# Patient Record
Sex: Female | Born: 1955 | Race: White | Hispanic: No | Marital: Married | Smoking: Former smoker
Health system: Southern US, Community
[De-identification: ages and names within clinical notes are randomized; demographics above are authoritative.]

## PROBLEM LIST (undated history)

## (undated) DIAGNOSIS — N200 Calculus of kidney: Secondary | ICD-10-CM

## (undated) DIAGNOSIS — R200 Anesthesia of skin: Secondary | ICD-10-CM

## (undated) DIAGNOSIS — R739 Hyperglycemia, unspecified: Secondary | ICD-10-CM

## (undated) DIAGNOSIS — H43819 Vitreous degeneration, unspecified eye: Secondary | ICD-10-CM

## (undated) DIAGNOSIS — M542 Cervicalgia: Secondary | ICD-10-CM

## (undated) DIAGNOSIS — H538 Other visual disturbances: Secondary | ICD-10-CM

## (undated) DIAGNOSIS — R0789 Other chest pain: Secondary | ICD-10-CM

## (undated) DIAGNOSIS — R0602 Shortness of breath: Secondary | ICD-10-CM

## (undated) DIAGNOSIS — H40059 Ocular hypertension, unspecified eye: Secondary | ICD-10-CM

## (undated) DIAGNOSIS — M5030 Other cervical disc degeneration, unspecified cervical region: Secondary | ICD-10-CM

## (undated) DIAGNOSIS — M5412 Radiculopathy, cervical region: Secondary | ICD-10-CM

## (undated) DIAGNOSIS — R202 Paresthesia of skin: Secondary | ICD-10-CM

## (undated) HISTORY — PX: COLONOSCOPY: SHX174

## (undated) HISTORY — PX: PHOTOREFRACTIVE KERATOTOMY: SHX216

## (undated) HISTORY — PX: ABDOMINAL HYSTERECTOMY: SHX81

## (undated) HISTORY — PX: CHOLECYSTECTOMY: SHX55

## (undated) HISTORY — PX: ESOPHAGOGASTRODUODENOSCOPY: SHX1529

## (undated) HISTORY — PX: TONSILLECTOMY: SUR1361

## (undated) HISTORY — PX: APPENDECTOMY: SHX54

---

## 2010-10-15 ENCOUNTER — Ambulatory Visit: Payer: Self-pay | Admitting: Nurse Practitioner

## 2010-10-21 ENCOUNTER — Ambulatory Visit: Payer: Self-pay | Admitting: Nurse Practitioner

## 2011-05-07 ENCOUNTER — Ambulatory Visit: Payer: Self-pay | Admitting: Nurse Practitioner

## 2012-09-21 ENCOUNTER — Ambulatory Visit: Payer: Self-pay | Admitting: Nurse Practitioner

## 2013-06-15 DIAGNOSIS — Z87442 Personal history of urinary calculi: Secondary | ICD-10-CM | POA: Insufficient documentation

## 2013-06-15 DIAGNOSIS — R739 Hyperglycemia, unspecified: Secondary | ICD-10-CM | POA: Insufficient documentation

## 2013-12-02 ENCOUNTER — Ambulatory Visit: Payer: Self-pay | Admitting: Gastroenterology

## 2013-12-06 ENCOUNTER — Ambulatory Visit: Payer: Self-pay | Admitting: Family Medicine

## 2013-12-07 LAB — PATHOLOGY REPORT

## 2013-12-09 ENCOUNTER — Ambulatory Visit: Payer: Self-pay | Admitting: Family Medicine

## 2014-09-06 DIAGNOSIS — M5412 Radiculopathy, cervical region: Secondary | ICD-10-CM | POA: Insufficient documentation

## 2014-09-06 DIAGNOSIS — M5030 Other cervical disc degeneration, unspecified cervical region: Secondary | ICD-10-CM | POA: Insufficient documentation

## 2014-12-19 DIAGNOSIS — M542 Cervicalgia: Secondary | ICD-10-CM | POA: Insufficient documentation

## 2014-12-19 DIAGNOSIS — G8929 Other chronic pain: Secondary | ICD-10-CM | POA: Insufficient documentation

## 2014-12-19 DIAGNOSIS — R200 Anesthesia of skin: Secondary | ICD-10-CM | POA: Insufficient documentation

## 2014-12-19 DIAGNOSIS — R202 Paresthesia of skin: Secondary | ICD-10-CM | POA: Insufficient documentation

## 2015-02-21 DIAGNOSIS — H538 Other visual disturbances: Secondary | ICD-10-CM | POA: Insufficient documentation

## 2015-02-26 ENCOUNTER — Other Ambulatory Visit: Payer: Self-pay | Admitting: Neurology

## 2015-02-26 DIAGNOSIS — G8929 Other chronic pain: Secondary | ICD-10-CM

## 2015-02-26 DIAGNOSIS — M542 Cervicalgia: Principal | ICD-10-CM

## 2015-03-02 ENCOUNTER — Ambulatory Visit: Payer: Self-pay

## 2015-03-06 ENCOUNTER — Ambulatory Visit
Admission: RE | Admit: 2015-03-06 | Discharge: 2015-03-06 | Disposition: A | Discharge status: Home / Self Care | Payer: BC Managed Care – PPO | Source: Ambulatory Visit | Attending: Neurology | Admitting: Neurology

## 2015-03-06 DIAGNOSIS — M47892 Other spondylosis, cervical region: Secondary | ICD-10-CM | POA: Diagnosis not present

## 2015-03-06 DIAGNOSIS — M5412 Radiculopathy, cervical region: Secondary | ICD-10-CM | POA: Diagnosis not present

## 2015-03-06 DIAGNOSIS — M542 Cervicalgia: Secondary | ICD-10-CM | POA: Diagnosis present

## 2015-03-06 DIAGNOSIS — G8929 Other chronic pain: Secondary | ICD-10-CM

## 2015-03-06 DIAGNOSIS — R200 Anesthesia of skin: Secondary | ICD-10-CM | POA: Diagnosis present

## 2017-10-20 ENCOUNTER — Other Ambulatory Visit: Payer: Self-pay | Admitting: Orthopedic Surgery

## 2017-10-20 DIAGNOSIS — M5416 Radiculopathy, lumbar region: Secondary | ICD-10-CM

## 2017-10-26 ENCOUNTER — Ambulatory Visit
Admission: RE | Admit: 2017-10-26 | Discharge: 2017-10-26 | Disposition: A | Discharge status: Home / Self Care | Payer: BC Managed Care – PPO | Source: Ambulatory Visit | Attending: Orthopedic Surgery | Admitting: Orthopedic Surgery

## 2017-10-26 DIAGNOSIS — M5416 Radiculopathy, lumbar region: Secondary | ICD-10-CM

## 2018-01-27 DIAGNOSIS — R0602 Shortness of breath: Secondary | ICD-10-CM | POA: Insufficient documentation

## 2018-02-12 DIAGNOSIS — H43811 Vitreous degeneration, right eye: Secondary | ICD-10-CM | POA: Insufficient documentation

## 2018-02-12 DIAGNOSIS — H179 Unspecified corneal scar and opacity: Secondary | ICD-10-CM | POA: Insufficient documentation

## 2018-02-12 DIAGNOSIS — H40053 Ocular hypertension, bilateral: Secondary | ICD-10-CM | POA: Insufficient documentation

## 2018-02-12 DIAGNOSIS — Z9889 Other specified postprocedural states: Secondary | ICD-10-CM | POA: Insufficient documentation

## 2018-04-20 ENCOUNTER — Encounter: Payer: Self-pay | Admitting: *Deleted

## 2018-04-21 ENCOUNTER — Ambulatory Visit: Payer: BC Managed Care – PPO | Admitting: Certified Registered"

## 2018-04-21 ENCOUNTER — Encounter
Admission: RE | Disposition: A | Discharge status: Home / Self Care | Payer: Self-pay | Source: Ambulatory Visit | Attending: Internal Medicine

## 2018-04-21 ENCOUNTER — Encounter: Payer: Self-pay | Admitting: Anesthesiology

## 2018-04-21 ENCOUNTER — Ambulatory Visit
Admission: RE | Admit: 2018-04-21 | Discharge: 2018-04-21 | Disposition: A | Discharge status: Home / Self Care | Payer: BC Managed Care – PPO | Source: Ambulatory Visit | Attending: Internal Medicine | Admitting: Internal Medicine

## 2018-04-21 DIAGNOSIS — R1013 Epigastric pain: Secondary | ICD-10-CM | POA: Diagnosis present

## 2018-04-21 DIAGNOSIS — R1310 Dysphagia, unspecified: Secondary | ICD-10-CM | POA: Insufficient documentation

## 2018-04-21 DIAGNOSIS — Z79899 Other long term (current) drug therapy: Secondary | ICD-10-CM | POA: Insufficient documentation

## 2018-04-21 DIAGNOSIS — K219 Gastro-esophageal reflux disease without esophagitis: Secondary | ICD-10-CM | POA: Insufficient documentation

## 2018-04-21 DIAGNOSIS — K319 Disease of stomach and duodenum, unspecified: Secondary | ICD-10-CM | POA: Diagnosis not present

## 2018-04-21 DIAGNOSIS — Z87891 Personal history of nicotine dependence: Secondary | ICD-10-CM | POA: Diagnosis not present

## 2018-04-21 HISTORY — DX: Cervicalgia: M54.2

## 2018-04-21 HISTORY — DX: Calculus of kidney: N20.0

## 2018-04-21 HISTORY — DX: Other chest pain: R07.89

## 2018-04-21 HISTORY — DX: Anesthesia of skin: R20.0

## 2018-04-21 HISTORY — DX: Radiculopathy, cervical region: M54.12

## 2018-04-21 HISTORY — DX: Vitreous degeneration, unspecified eye: H43.819

## 2018-04-21 HISTORY — DX: Other cervical disc degeneration, unspecified cervical region: M50.30

## 2018-04-21 HISTORY — DX: Hypercalcemia: E83.52

## 2018-04-21 HISTORY — PX: ESOPHAGOGASTRODUODENOSCOPY (EGD) WITH PROPOFOL: SHX5813

## 2018-04-21 HISTORY — DX: Shortness of breath: R06.02

## 2018-04-21 HISTORY — DX: Ocular hypertension, unspecified eye: H40.059

## 2018-04-21 HISTORY — DX: Paresthesia of skin: R20.2

## 2018-04-21 HISTORY — DX: Other visual disturbances: H53.8

## 2018-04-21 HISTORY — DX: Hyperglycemia, unspecified: R73.9

## 2018-04-21 SURGERY — ESOPHAGOGASTRODUODENOSCOPY (EGD) WITH PROPOFOL
Anesthesia: General

## 2018-04-21 MED ORDER — SODIUM CHLORIDE 0.9 % IV SOLN
INTRAVENOUS | Status: DC
  Administered 2018-04-21: 1000 mL via INTRAVENOUS

## 2018-04-21 MED ORDER — FENTANYL CITRATE (PF) 100 MCG/2ML IJ SOLN
INTRAMUSCULAR | Status: AC
  Filled 2018-04-21: qty 2

## 2018-04-21 MED ORDER — LIDOCAINE HCL (PF) 1 % IJ SOLN
INTRAMUSCULAR | Status: AC
  Administered 2018-04-21: 0.3 mL via INTRADERMAL
  Filled 2018-04-21: qty 2

## 2018-04-21 MED ORDER — LIDOCAINE HCL (CARDIAC) PF 100 MG/5ML IV SOSY
PREFILLED_SYRINGE | INTRAVENOUS | Status: DC | PRN
  Administered 2018-04-21: 80 mg via INTRAVENOUS

## 2018-04-21 MED ORDER — PROPOFOL 10 MG/ML IV BOLUS
INTRAVENOUS | Status: DC | PRN
  Administered 2018-04-21: 20 mg via INTRAVENOUS
  Administered 2018-04-21: 80 mg via INTRAVENOUS

## 2018-04-21 MED ORDER — FENTANYL CITRATE (PF) 100 MCG/2ML IJ SOLN
INTRAMUSCULAR | Status: DC | PRN
  Administered 2018-04-21: 50 ug via INTRAVENOUS

## 2018-04-21 MED ORDER — LIDOCAINE HCL (PF) 1 % IJ SOLN
2.0000 mL | Freq: Once | INTRAMUSCULAR | Status: AC
  Administered 2018-04-21: 0.3 mL via INTRADERMAL

## 2018-04-21 MED ORDER — PROPOFOL 10 MG/ML IV BOLUS
INTRAVENOUS | Status: AC
  Filled 2018-04-21: qty 20

## 2018-04-21 NOTE — Anesthesia Post-op Follow-up Note (Signed)
Anesthesia QCDR form completed.        

## 2018-04-21 NOTE — Interval H&P Note (Signed)
History and Physical Interval Note:  04/21/2018 11:37 AM  Tina Crawford  has presented today for surgery, with the diagnosis of DYSPHAGIA  The various methods of treatment have been discussed with the patient and family. After consideration of risks, benefits and other options for treatment, the patient has consented to  Procedure(s): ESOPHAGOGASTRODUODENOSCOPY (EGD) WITH PROPOFOL (N/A) as a surgical intervention .  The patient's history has been reviewed, patient examined, no change in status, stable for surgery.  I have reviewed the patient's chart and labs.  Questions were answered to the patient's satisfaction.     [place], [place]

## 2018-04-21 NOTE — Anesthesia Postprocedure Evaluation (Signed)
Anesthesia Post Note  Patient: Tina Crawford  Procedure(s) Performed: ESOPHAGOGASTRODUODENOSCOPY (EGD) WITH PROPOFOL (N/A )  Patient location during evaluation: Endoscopy Anesthesia Type: General Level of consciousness: awake and alert Pain management: pain level controlled Vital Signs Assessment: post-procedure vital signs reviewed and stable Respiratory status: spontaneous breathing, nonlabored ventilation, respiratory function stable and patient connected to nasal cannula oxygen Cardiovascular status: blood pressure returned to baseline and stable Postop Assessment: no apparent nausea or vomiting Anesthetic complications: no     Last Vitals:  Vitals:   04/21/18 1210 04/21/18 1220  BP: 122/72 130/76  Pulse: 64 67  Resp: 17 14  Temp:    SpO2: 96% 99%    Last Pain:  Vitals:   04/21/18 1220  TempSrc:   PainSc: 0-No pain                 Lenard Simmer

## 2018-04-21 NOTE — Op Note (Signed)
[facility] Gastroenterology Patient Name: Tina Crawford Procedure Date: 04/21/2018 11:05 AM MRN: [id number] Account #: [id number] Date of Birth: [birth year] Admit Type: Outpatient Age: 62 Room: [facility] ENDO ROOM 3 Gender: Female Note Status: Finalized Procedure:            Upper GI endoscopy Indications:          Epigastric abdominal pain, Dysphagia, Follow-up of                        esophageal reflux Providers:            Boykin Nearing. [name] MD, MD Referring MD:         Letitia Caul MD, MD (Referring MD) Medicines:            Propofol per Anesthesia Complications:        No immediate complications. Procedure:            Pre-Anesthesia Assessment:                       - The risks and benefits of the procedure and the                        sedation options and risks were discussed with the                        patient. All questions were answered and informed                        consent was obtained.                       - Patient identification and proposed procedure were                        verified prior to the procedure by the nurse. The                        procedure was verified in the procedure room.                       - ASA Grade Assessment: III - A patient with severe                        systemic disease.                       - After reviewing the risks and benefits, the patient                        was deemed in satisfactory condition to undergo the                        procedure.                       After obtaining informed consent, the endoscope was                        passed under direct vision. Throughout the procedure,  the patient's blood pressure, pulse, and oxygen                        saturations were monitored continuously. The Endoscope                        was introduced through the mouth, and advanced to the                        third part of duodenum. The upper GI endoscopy was                    accomplished without difficulty. The patient tolerated                        the procedure well. Findings:      No endoscopic abnormality was evident in the esophagus to explain the       patient's complaint of dysphagia. It was decided, however, to proceed       with dilation at the lower esophageal sphincter. The scope was       withdrawn. Dilation was performed with a Maloney dilator with no       resistance at 54 Fr.      Localized mild inflammation characterized by erosions and erythema was       found in the gastric antrum. Biopsies were taken with a cold forceps for       Helicobacter pylori testing.      The cardia and gastric fundus were normal on retroflexion.      The examined duodenum was normal.      The exam was otherwise without abnormality. Impression:           - No endoscopic esophageal abnormality to explain                        patient's dysphagia. Esophagus dilated. Dilated.                       - Gastritis. Biopsied.                       - Normal examined duodenum.                       - The examination was otherwise normal. Recommendation:       - Await pathology results.                       - Patient has a contact number available for                        emergencies. The signs and symptoms of potential                        delayed complications were discussed with the patient.                        Return to normal activities tomorrow. Written discharge                        instructions were provided to the patient.                       -  Resume previous diet.                       - Continue present medications.                       - Return to my office in 3 months.                       - The findings and recommendations were discussed with                        the patient and their spouse. Procedure Code(s):    --- Professional ---                       [id number], Esophagogastroduodenoscopy, flexible, transoral;                         with biopsy, single or multiple                       43450, Dilation of esophagus, by unguided sound or                        bougie, single or multiple passes Diagnosis Code(s):    --- Professional ---                       K21.9, Gastro-esophageal reflux disease without                        esophagitis                       R10.13, Epigastric pain                       K29.70, Gastritis, unspecified, without bleeding                       R13.10, Dysphagia, unspecified CPT copyright 2018 American Medical Association. All rights reserved. The codes documented in this report are preliminary and upon coder review may  be revised to meet current compliance requirements. [name] MD, MD 04/21/2018 11:50:58 AM This report has been signed electronically. Number of Addenda: 0 Note Initiated On: 04/21/2018 11:05 AM      [facility]

## 2018-04-21 NOTE — H&P (Signed)
Outpatient short stay form Pre-procedure 04/21/2018 11:09 AM Tina Crawford, M.D.  Primary Physician: Rolm Gala, MD  Reason for visit: Epigastric pain, GERD, dysphagia  History of present illness: Patient who is 62 years old complains of epigastric pain and dysphagia.  She also has GERD which is partially controlled with haphazard intake of proton pump inhibitors.  On the last office patient visit on March 18, 2018, the patient was given prescription for pantoprazole 40 mg twice daily. She is doing better with both dysphagia and GERD symptoms on the double dose, though she admits she does not strictly adhere to everyday dosing.     Current Facility-Administered Medications:  .  0.9 %  sodium chloride infusion, , Intravenous, Continuous, [place], Boykin Nearing, MD, Last Rate: 20 mL/hr at 04/21/18 1105  Medications Prior to Admission  Medication Sig Dispense Refill Last Dose  . cholecalciferol (VITAMIN D) 1000 units tablet Take 1,000 Units by mouth daily.     . diclofenac (VOLTAREN) 75 MG EC tablet Take 75 mg by mouth 2 (two) times daily.     . diphenhydrAMINE (BENADRYL) 25 mg capsule Take 25 mg by mouth every 6 (six) hours as needed.     . loratadine (CLARITIN) 10 MG tablet Take 10 mg by mouth daily.     . methocarbamol (ROBAXIN) 750 MG tablet Take 750 mg by mouth 4 (four) times daily.     . Multiple Vitamin (MULTIVITAMIN) tablet Take 1 tablet by mouth daily.     . pantoprazole (PROTONIX) 40 MG tablet Take 40 mg by mouth daily.     . traMADol (ULTRAM) 50 MG tablet Take by mouth every 6 (six) hours as needed.     . Turmeric 400 MG CAPS Take by mouth.        Allergies  Allergen Reactions  . Hydrocodone Other (See Comments)     Past Medical History:  Diagnosis Date  . Atypical chest pain   . Blurry vision, bilateral   . DDD (degenerative disc disease), cervical   . Hypercalcemia   . Hyperglycemia   . Kidney stone   . Neck pain   . Numbness and tingling   . Ocular  hypertension   . PVD (posterior vitreous detachment)    Right  . Radiculopathy of cervical region   . SOB (shortness of breath)     Review of systems:  Otherwise negative.    Physical Exam  Gen: Alert, oriented. Appears stated age.  HEENT: [place]/AT. PERRLA. Lungs: CTA, no wheezes. CV: RR nl S1, S2. Abd: soft, benign, no masses. BS+ Ext: No edema. Pulses 2+    Planned procedures: Proceed with EGD. The patient understands the nature of the planned procedure, indications, risks, alternatives and potential complications including but not limited to bleeding, infection, perforation, damage to internal organs and possible oversedation/side effects from anesthesia. The patient agrees and gives consent to proceed.  Please refer to procedure notes for findings, recommendations and patient disposition/instructions.     Tina Crawford, M.D. Gastroenterology 04/21/2018  11:09 AM

## 2018-04-21 NOTE — Transfer of Care (Signed)
Immediate Anesthesia Transfer of Care Note  Patient: Tina Crawford  Procedure(s) Performed: ESOPHAGOGASTRODUODENOSCOPY (EGD) WITH PROPOFOL (N/A )  Patient Location: PACU  Anesthesia Type:General  Level of Consciousness: sedated  Airway & Oxygen Therapy: Patient Spontanous Breathing and Patient connected to nasal cannula oxygen  Post-op Assessment: Report given to RN and Post -op Vital signs reviewed and stable  Post vital signs: Reviewed and stable  Last Vitals:  Vitals Value Taken Time  BP    Temp    Pulse    Resp    SpO2      Last Pain:  Vitals:   04/21/18 1038  TempSrc: Tympanic         Complications: No apparent anesthesia complications

## 2018-04-21 NOTE — Anesthesia Preprocedure Evaluation (Signed)
Anesthesia Evaluation  Patient identified by MRN, date of birth, ID band Patient awake    Reviewed: Allergy & Precautions, H&P , NPO status , Patient's Chart, lab work & pertinent test results, reviewed documented beta blocker date and time   History of Anesthesia Complications Negative for: history of anesthetic complications  Airway Mallampati: III  TM Distance: >3 FB Neck ROM: full  Mouth opening: Limited Mouth Opening  Dental  (+) Caps, Dental Advidsory Given, Teeth Intact   Pulmonary neg pulmonary ROS, former smoker,           Cardiovascular Exercise Tolerance: Good negative cardio ROS       Neuro/Psych negative neurological ROS  negative psych ROS   GI/Hepatic Neg liver ROS, GERD  ,  Endo/Other  negative endocrine ROS  Renal/GU Renal disease (kidney stones)  negative genitourinary   Musculoskeletal   Abdominal   Peds  Hematology negative hematology ROS (+)   Anesthesia Other Findings Past Medical History: No date: Atypical chest pain No date: Blurry vision, bilateral No date: DDD (degenerative disc disease), cervical No date: Hypercalcemia No date: Hyperglycemia No date: Kidney stone No date: Neck pain No date: Numbness and tingling No date: Ocular hypertension No date: PVD (posterior vitreous detachment)     Comment:  Right No date: Radiculopathy of cervical region No date: SOB (shortness of breath)   Reproductive/Obstetrics negative OB ROS                             Anesthesia Physical Anesthesia Plan  ASA: II  Anesthesia Plan: General   Post-op Pain Management:    Induction: Intravenous  PONV Risk Score and Plan: 3 and Propofol infusion and TIVA  Airway Management Planned: Natural Airway and Nasal Cannula  Additional Equipment:   Intra-op Plan:   Post-operative Plan:   Informed Consent: I have reviewed the patients History and Physical, chart, labs and  discussed the procedure including the risks, benefits and alternatives for the proposed anesthesia with the patient or authorized representative who has indicated his/her understanding and acceptance.   Dental Advisory Given  Plan Discussed with: Anesthesiologist, CRNA and Surgeon  Anesthesia Plan Comments:         Anesthesia Quick Evaluation

## 2018-04-22 ENCOUNTER — Encounter: Payer: Self-pay | Admitting: Internal Medicine

## 2018-04-23 LAB — SURGICAL PATHOLOGY

## 2018-07-29 DIAGNOSIS — K921 Melena: Secondary | ICD-10-CM | POA: Insufficient documentation

## 2018-07-29 DIAGNOSIS — Z8719 Personal history of other diseases of the digestive system: Secondary | ICD-10-CM | POA: Insufficient documentation

## 2018-07-29 DIAGNOSIS — K219 Gastro-esophageal reflux disease without esophagitis: Secondary | ICD-10-CM | POA: Insufficient documentation

## 2018-07-29 DIAGNOSIS — Z780 Asymptomatic menopausal state: Secondary | ICD-10-CM | POA: Insufficient documentation

## 2018-07-29 DIAGNOSIS — R928 Other abnormal and inconclusive findings on diagnostic imaging of breast: Secondary | ICD-10-CM | POA: Insufficient documentation

## 2018-10-11 ENCOUNTER — Ambulatory Visit: Payer: Self-pay | Admitting: Urology

## 2018-11-04 DIAGNOSIS — K5904 Chronic idiopathic constipation: Secondary | ICD-10-CM | POA: Insufficient documentation

## 2018-12-06 ENCOUNTER — Ambulatory Visit: Payer: Self-pay | Admitting: Urology

## 2018-12-06 ENCOUNTER — Encounter: Payer: Self-pay | Admitting: Urology

## 2019-01-31 ENCOUNTER — Other Ambulatory Visit: Payer: Self-pay

## 2019-01-31 ENCOUNTER — Encounter: Payer: Self-pay | Admitting: Urology

## 2019-01-31 ENCOUNTER — Ambulatory Visit: Payer: Self-pay | Admitting: Urology

## 2019-01-31 ENCOUNTER — Ambulatory Visit: Payer: Medicare Other | Admitting: Urology

## 2019-01-31 VITALS — BP 138/84 | HR 77 | Ht 67.0 in | Wt 220.0 lb

## 2019-01-31 DIAGNOSIS — R310 Gross hematuria: Secondary | ICD-10-CM

## 2019-01-31 LAB — MICROSCOPIC EXAMINATION

## 2019-01-31 LAB — URINALYSIS, COMPLETE
Bilirubin, UA: NEGATIVE
Glucose, UA: NEGATIVE
Ketones, UA: NEGATIVE
Leukocytes,UA: NEGATIVE
Nitrite, UA: NEGATIVE
Protein,UA: NEGATIVE
Specific Gravity, UA: 1.02 (ref 1.005–1.030)
Urobilinogen, Ur: 0.2 mg/dL (ref 0.2–1.0)
pH, UA: 5.5 (ref 5.0–7.5)

## 2019-01-31 NOTE — Progress Notes (Signed)
01/31/2019 1:46 PM   Tina Crawford 03-22-1956 [id number]  Referring provider: Rolm GalaGrandis, Heidi, MD [address] [address] [place] [id number]   Chief Complaint  Patient presents with  . Hematuria    HPI: I was consulted to assess the patient's 2 or 3 episodes of gross hematuria dating back to January.  The initial episode cleared with antibiotics.  She may get a little bit of flank discomfort on the left side or increased frequency and pressure during the events but no acute pain or burning  She had lithotripsy years ago.  She quit smoking years ago and does not take daily aspirin or blood thinners  She only voids twice a day and generally stays continent.  She does not wear a pad and has no nocturia  No previous bladder surgery.  She is to get bladder infections.  No neurologic issues  Modifying factors: There are no other modifying factors  Associated signs and symptoms: There are no other associated signs and symptoms Aggravating and relieving factors: There are no other aggravating or relieving factors Severity: Moderate Duration: Persistent   PMH: Past Medical History:  Diagnosis Date  . Atypical chest pain   . Blurry vision, bilateral   . DDD (degenerative disc disease), cervical   . Hypercalcemia   . Hyperglycemia   . Kidney stone   . Neck pain   . Numbness and tingling   . Ocular hypertension   . PVD (posterior vitreous detachment)    Right  . Radiculopathy of cervical region   . SOB (shortness of breath)     Surgical History: Past Surgical History:  Procedure Laterality Date  . ABDOMINAL HYSTERECTOMY    . APPENDECTOMY    . CHOLECYSTECTOMY    . COLONOSCOPY    . ESOPHAGOGASTRODUODENOSCOPY    . ESOPHAGOGASTRODUODENOSCOPY (EGD) WITH PROPOFOL N/A 04/21/2018   Procedure: ESOPHAGOGASTRODUODENOSCOPY (EGD) WITH PROPOFOL;  Surgeon: Toledo, Boykin Nearingeodoro K, MD;  Location: [facility] ENDOSCOPY;  Service: Gastroenterology;  Laterality: N/A;  . PHOTOREFRACTIVE KERATOTOMY  Bilateral   . TONSILLECTOMY      Home Medications:  Allergies as of 01/31/2019      Reactions   Hydrocodone Other (See Comments)      Medication List       Accurate as of January 31, 2019  1:46 PM. If you have any questions, ask your nurse or doctor.        STOP taking these medications   diclofenac 75 MG EC tablet Commonly known as: VOLTAREN Stopped by: Martina SinnerScott A Leovanni Bjorkman, MD     TAKE these medications   cholecalciferol 1000 units tablet Commonly known as: VITAMIN D Take 1,000 Units by mouth daily.   diphenhydrAMINE 25 mg capsule Commonly known as: BENADRYL Take 25 mg by mouth every 6 (six) hours as needed.   loratadine 10 MG tablet Commonly known as: CLARITIN Take 10 mg by mouth daily.   methocarbamol 750 MG tablet Commonly known as: ROBAXIN Take 750 mg by mouth 4 (four) times daily.   multivitamin tablet Take 1 tablet by mouth daily.   pantoprazole 40 MG tablet Commonly known as: PROTONIX Take 40 mg by mouth daily.   traMADol 50 MG tablet Commonly known as: ULTRAM Take by mouth every 6 (six) hours as needed.   Turmeric 400 MG Caps Take by mouth.       Allergies:  Allergies  Allergen Reactions  . Hydrocodone Other (See Comments)    Family History: Family History  Problem Relation Age of Onset  .  Kidney cancer Maternal Aunt     Social History:  reports that she quit smoking about 28 years ago. She has never used smokeless tobacco. She reports current alcohol use. She reports that she does not use drugs.  ROS: UROLOGY Frequent Urination?: No Hard to postpone urination?: No Burning/pain with urination?: No Get up at night to urinate?: No Leakage of urine?: No Urine stream starts and stops?: No Trouble starting stream?: No Do you have to strain to urinate?: No Blood in urine?: Yes Urinary tract infection?: No Sexually transmitted disease?: No Injury to kidneys or bladder?: No Painful intercourse?: No Weak stream?: No Currently  pregnant?: No Vaginal bleeding?: No Last menstrual period?: n  Gastrointestinal Nausea?: No Vomiting?: No Indigestion/heartburn?: No Diarrhea?: No Constipation?: No  Constitutional Fever: No Night sweats?: No Weight loss?: No Fatigue?: No  Skin Skin rash/lesions?: No Itching?: No  Eyes Blurred vision?: No Double vision?: No  Ears/Nose/Throat Sore throat?: No Sinus problems?: No  Hematologic/Lymphatic Swollen glands?: No Easy bruising?: No  Cardiovascular Leg swelling?: No Chest pain?: No  Respiratory Cough?: No Shortness of breath?: No  Endocrine Excessive thirst?: No  Musculoskeletal Back pain?: No Joint pain?: No  Neurological Headaches?: No Dizziness?: No  Psychologic Depression?: No Anxiety?: No  Physical Exam: BP 138/84   Pulse 77   Ht 5\' 7"  (1.702 m)   Wt 220 lb (99.8 kg)   BMI 34.46 kg/m   Constitutional:  Alert and oriented, No acute distress. HEENT: [place] AT, moist mucus membranes.  Trachea midline, no masses. Cardiovascular: No clubbing, cyanosis, or edema. Respiratory: Normal respiratory effort, no increased work of breathing. GI: Abdomen is soft, nontender, nondistended, no abdominal masses GU: No CVA tenderness.  No bladder tenderness Skin: No rashes, bruises or suspicious lesions. Lymph: No cervical or inguinal adenopathy. Neurologic: Grossly intact, no focal deficits, moving all 4 extremities. Psychiatric: Normal mood and affect.  Laboratory Data: No results found for: WBC, HGB, HCT, MCV, PLT  No results found for: CREATININE  No results found for: PSA  No results found for: TESTOSTERONE  No results found for: HGBA1C  Urinalysis No results found for: COLORURINE, APPEARANCEUR, LABSPEC, PHURINE, GLUCOSEU, HGBUR, BILIRUBINUR, KETONESUR, PROTEINUR, UROBILINOGEN, NITRITE, LEUKOCYTESUR  Pertinent Imaging: None  Assessment & Plan: Patient has history of hematuria.  Work-up described.  Order CT scan with lab test.  No  urine for culture today but she had a trace of blood on analysis.  Return for pelvic examination and cystoscopy.  We had a nice conversation about [facility]  1. Gross hematuria  - Urinalysis, Complete   No follow-ups on file.  Reece Packer, MD  [place] [address] [place], [place] [address] [phone]

## 2019-01-31 NOTE — Addendum Note (Signed)
Addended by: Verlene Mayer A on: 01/31/2019 01:57 PM   Modules accepted: Orders

## 2019-02-07 ENCOUNTER — Ambulatory Visit: Payer: Self-pay | Admitting: Urology

## 2019-02-16 ENCOUNTER — Ambulatory Visit
Admission: RE | Admit: 2019-02-16 | Discharge: 2019-02-16 | Disposition: A | Discharge status: Home / Self Care | Payer: Medicare Other | Source: Ambulatory Visit | Attending: Urology | Admitting: Urology

## 2019-02-16 ENCOUNTER — Other Ambulatory Visit: Payer: Self-pay

## 2019-02-16 DIAGNOSIS — R310 Gross hematuria: Secondary | ICD-10-CM | POA: Insufficient documentation

## 2019-02-16 LAB — POCT I-STAT CREATININE: Creatinine, Ser: 0.7 mg/dL (ref 0.44–1.00)

## 2019-02-16 MED ORDER — IOHEXOL 300 MG/ML  SOLN
125.0000 mL | Freq: Once | INTRAMUSCULAR | Status: AC | PRN
  Administered 2019-02-16: 125 mL via INTRAVENOUS

## 2019-02-28 ENCOUNTER — Ambulatory Visit: Payer: Medicare Other | Admitting: Urology

## 2019-02-28 ENCOUNTER — Encounter: Payer: Self-pay | Admitting: Urology

## 2019-02-28 ENCOUNTER — Other Ambulatory Visit: Payer: Self-pay

## 2019-02-28 VITALS — BP 133/79 | HR 80 | Ht 66.0 in | Wt 224.0 lb

## 2019-02-28 DIAGNOSIS — R310 Gross hematuria: Secondary | ICD-10-CM

## 2019-02-28 DIAGNOSIS — N3946 Mixed incontinence: Secondary | ICD-10-CM

## 2019-02-28 NOTE — Progress Notes (Signed)
02/28/2019 3:09 PM   Armando GangDiana L Holtzclaw 1955/10/14 [id number]  Referring provider: Rolm GalaGrandis, Heidi, MD [address] [place],  [place] [id number]  No chief complaint on file.   HPI: I was consulted to assess the patient's 2 or 3 episodes of gross hematuria dating back to January.  The initial episode cleared with antibiotics.  She may get a little bit of flank discomfort on the left side or increased frequency and pressure during the events but no acute pain or burning  She had lithotripsy years ago.  She quit smoking years ago and does not take daily aspirin or blood thinners  She only voids twice a day and generally stays continent.  She does not wear a pad and has no nocturia   Day Frequency stable.  No urine culture sent.  CT scan mistreated 9 mm nonobstructing stone in right kidney and 2 or 3 mm nonobstructing stones in both kidneys Moderate grade 1 cystocele asymptomatic on pelvic examination No more blood in the urine  Cystoscopy: Patient underwent flexible cystoscopy using sterile technique.  Bladder mucosa and trigone were normal.  No cystitis.  No carcinoma.  No urethral abnormalities.  I looked 3 times throughout the bladder.  She tolerated the procedure well    PMH: Past Medical History:  Diagnosis Date  . Atypical chest pain   . Blurry vision, bilateral   . DDD (degenerative disc disease), cervical   . Hypercalcemia   . Hyperglycemia   . Kidney stone   . Neck pain   . Numbness and tingling   . Ocular hypertension   . PVD (posterior vitreous detachment)    Right  . Radiculopathy of cervical region   . SOB (shortness of breath)     Surgical History: Past Surgical History:  Procedure Laterality Date  . ABDOMINAL HYSTERECTOMY    . APPENDECTOMY    . CHOLECYSTECTOMY    . COLONOSCOPY    . ESOPHAGOGASTRODUODENOSCOPY    . ESOPHAGOGASTRODUODENOSCOPY (EGD) WITH PROPOFOL N/A 04/21/2018   Procedure: ESOPHAGOGASTRODUODENOSCOPY (EGD) WITH PROPOFOL;  Surgeon: Toledo,  Boykin Nearingeodoro K, MD;  Location: [facility] ENDOSCOPY;  Service: Gastroenterology;  Laterality: N/A;  . PHOTOREFRACTIVE KERATOTOMY Bilateral   . TONSILLECTOMY      Home Medications:  Allergies as of 02/28/2019      Reactions   Hydrocodone Other (See Comments)      Medication List       Accurate as of February 28, 2019  3:09 PM. If you have any questions, ask your nurse or doctor.        cholecalciferol 1000 units tablet Commonly known as: VITAMIN D Take 1,000 Units by mouth daily.   diphenhydrAMINE 25 mg capsule Commonly known as: BENADRYL Take 25 mg by mouth every 6 (six) hours as needed.   loratadine 10 MG tablet Commonly known as: CLARITIN Take 10 mg by mouth daily.   methocarbamol 750 MG tablet Commonly known as: ROBAXIN Take 750 mg by mouth 4 (four) times daily.   multivitamin tablet Take 1 tablet by mouth daily.   pantoprazole 40 MG tablet Commonly known as: PROTONIX Take 40 mg by mouth daily.   traMADol 50 MG tablet Commonly known as: ULTRAM Take by mouth every 6 (six) hours as needed.   Turmeric 400 MG Caps Take by mouth.       Allergies:  Allergies  Allergen Reactions  . Hydrocodone Other (See Comments)    Family History: Family History  Problem Relation Age of Onset  . Kidney cancer  Maternal Aunt     Social History:  reports that she quit smoking about 28 years ago. She has never used smokeless tobacco. She reports current alcohol use. She reports that she does not use drugs.  ROS:                                        Physical Exam: There were no vitals taken for this visit.  Constitutional:  Alert and oriented, No acute distress.   Laboratory Data: No results found for: WBC, HGB, HCT, MCV, PLT  Lab Results  Component Value Date   CREATININE 0.70 02/16/2019    No results found for: PSA  No results found for: TESTOSTERONE  No results found for: HGBA1C  Urinalysis    Component Value Date/Time   APPEARANCEUR  Hazy (A) 01/31/2019 1334   GLUCOSEU Negative 01/31/2019 1334   BILIRUBINUR Negative 01/31/2019 1334   PROTEINUR Negative 01/31/2019 1334   NITRITE Negative 01/31/2019 1334   LEUKOCYTESUR Negative 01/31/2019 1334    Pertinent Imaging:   Assessment & Plan: Picture drawn.  Patient is asymptomatic stones.  Otherwise cleared for blood in urine.  I will see her as needed.  Urine sent for culture  There are no diagnoses linked to this encounter.  No follow-ups on file.  Reece Packer, MD  [place] [address] [place], [place] [address] [phone]

## 2019-03-01 LAB — URINALYSIS, COMPLETE
Bilirubin, UA: NEGATIVE
Glucose, UA: NEGATIVE
Ketones, UA: NEGATIVE
Leukocytes,UA: NEGATIVE
Nitrite, UA: NEGATIVE
Protein,UA: NEGATIVE
RBC, UA: NEGATIVE
Specific Gravity, UA: 1.025 (ref 1.005–1.030)
Urobilinogen, Ur: 0.2 mg/dL (ref 0.2–1.0)
pH, UA: 5.5 (ref 5.0–7.5)

## 2019-03-01 LAB — MICROSCOPIC EXAMINATION: RBC: NONE SEEN /hpf (ref 0–2)

## 2019-03-02 LAB — CULTURE, URINE COMPREHENSIVE

## 2019-09-19 DIAGNOSIS — Z1379 Encounter for other screening for genetic and chromosomal anomalies: Secondary | ICD-10-CM | POA: Insufficient documentation

## 2020-05-19 IMAGING — CT CT ABDOMEN AND PELVIS WITHOUT AND WITH CONTRAST
3 of 12 series · 11 of 46 positions shown, 17 images · IV contrast (omnipaque)
Comparison: None

CLINICAL DATA: Hematuria.

EXAM:
CT ABDOMEN AND PELVIS WITHOUT AND WITH CONTRAST
TECHNIQUE: Multidetector CT imaging of the abdomen and pelvis was performed
following the standard protocol before and following the bolus
administration of intravenous contrast.
CONTRAST:  125mL OMNIPAQUE IOHEXOL 300 MG/ML  SOLN

[Series 5: coronal without pre · coronal · non-contrast · 0.72mm/px · 2 of 149 slices shown, 3 images]
[im 50/149  soft-tissue]
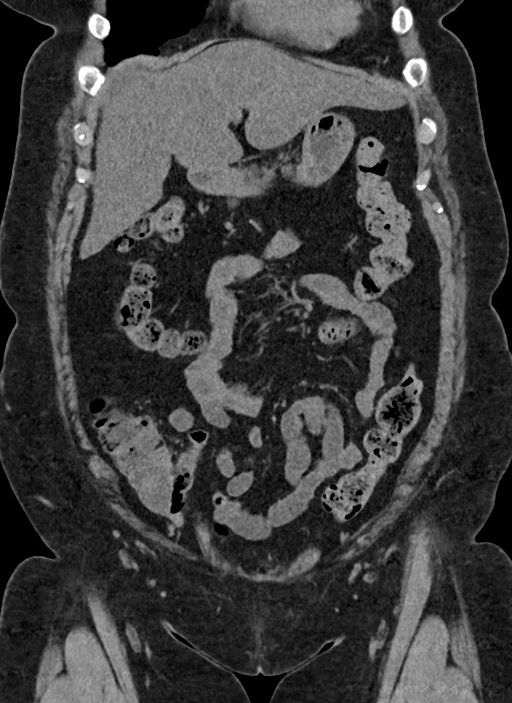
[im 50/149  bone]
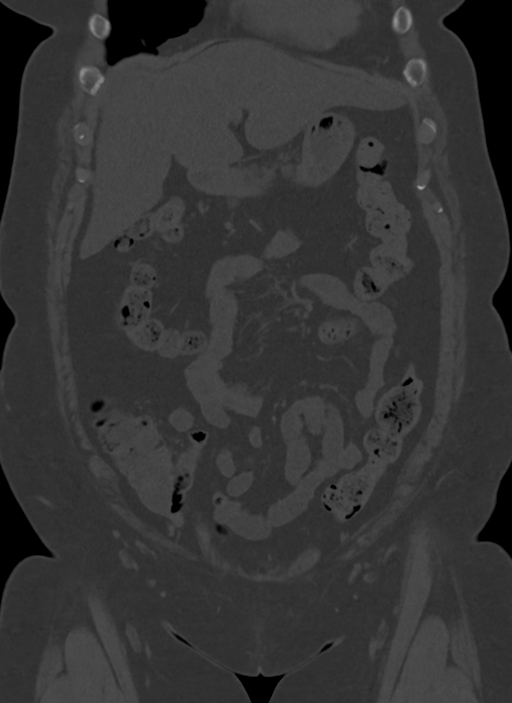
[im 99/149  soft-tissue]
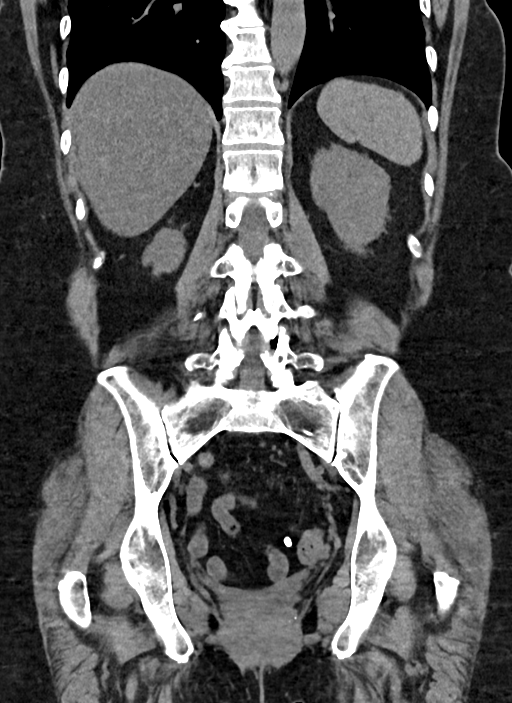

[Series 9: axial hematuria with · axial · 0.72mm/px · z∈[-1593,-1453]mm · 3 of 100 slices shown]
[im 15/100  soft-tissue]
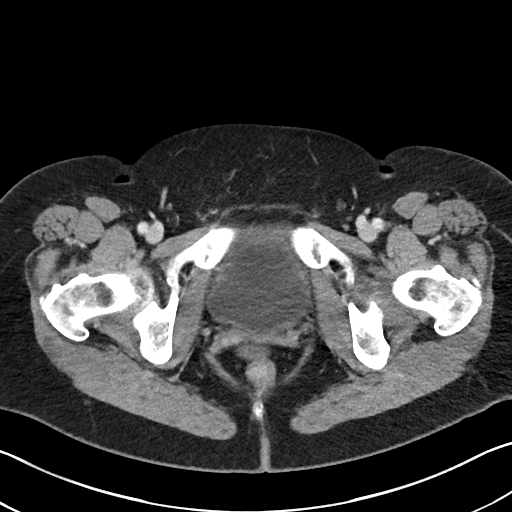
[im 29/100  soft-tissue]
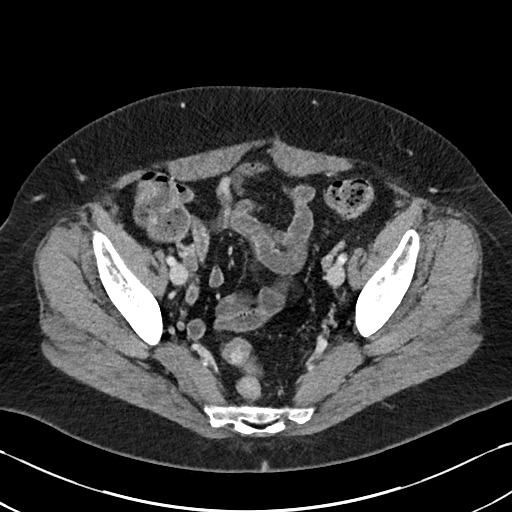
[im 43/100  soft-tissue]
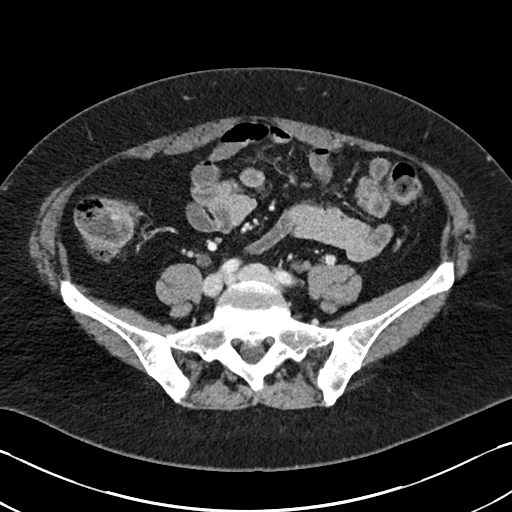

[Series 17: axial delay prone · axial · delayed · 0.72mm/px · z∈[-1629,-1254]mm · 6 of 106 slices shown, 11 images]
[im 16/106  soft-tissue]
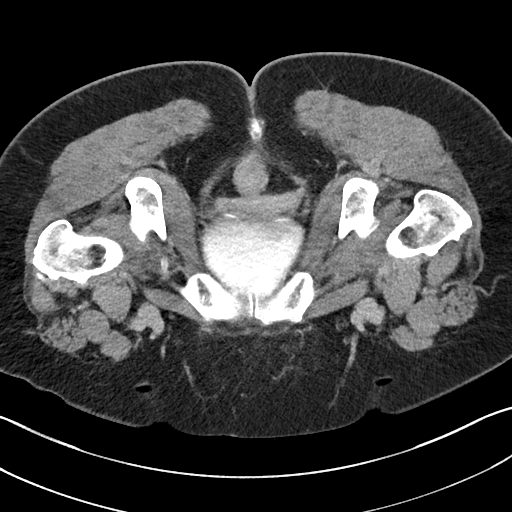
[im 16/106  bone]
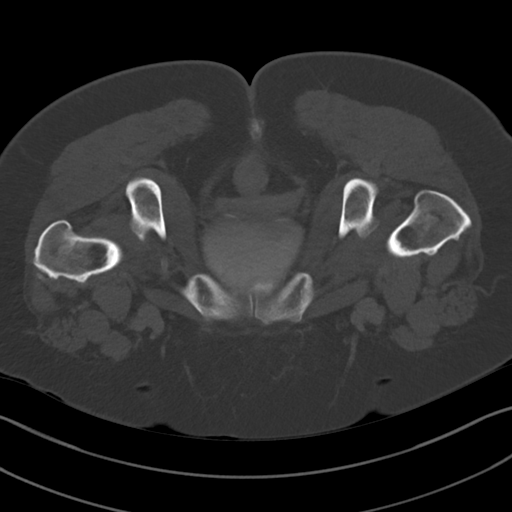
[im 31/106  soft-tissue]
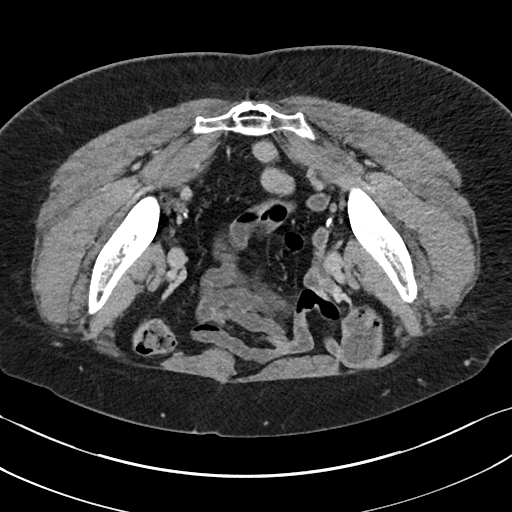
[im 46/106  soft-tissue]
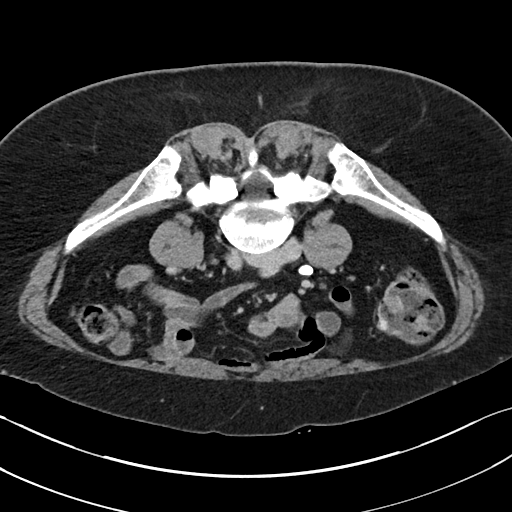
[im 46/106  lung]
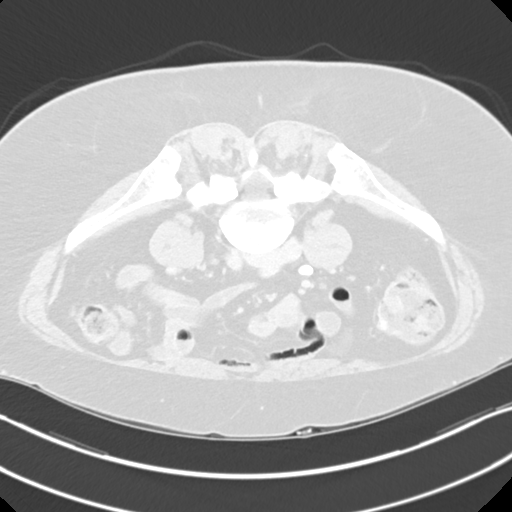
[im 61/106  soft-tissue]
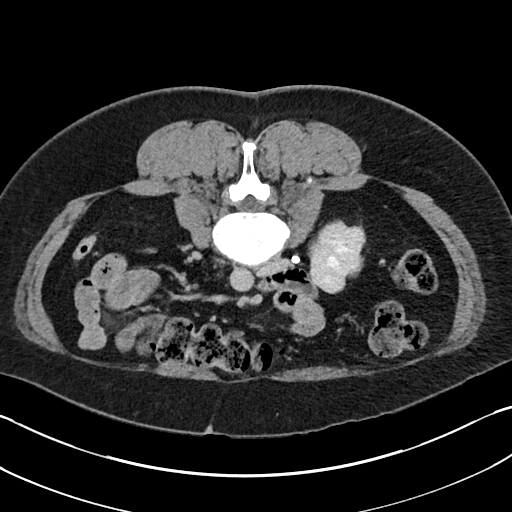
[im 61/106  lung]
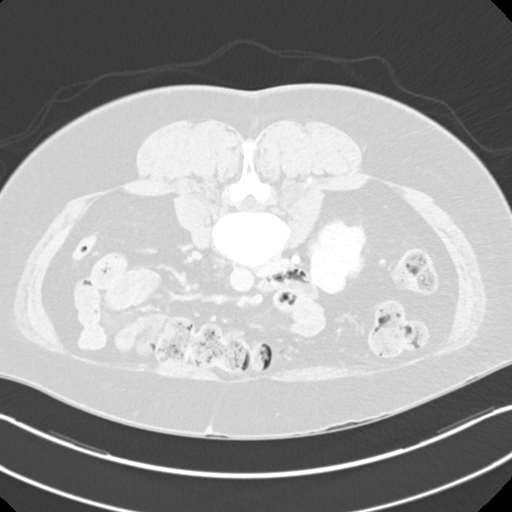
[im 76/106  soft-tissue]
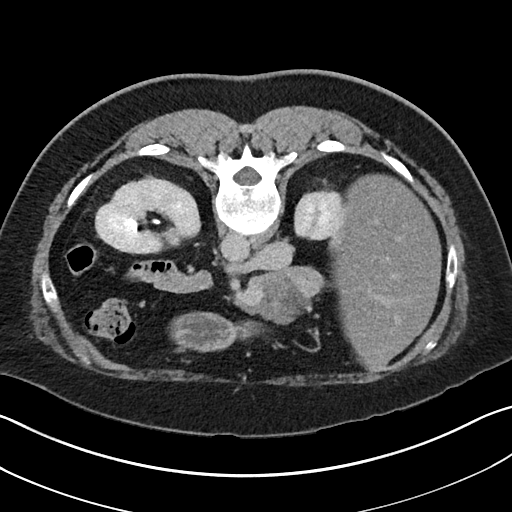
[im 76/106  lung]
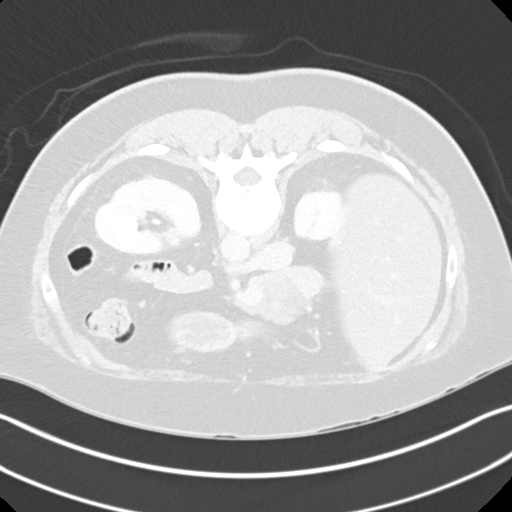
[im 91/106  soft-tissue]
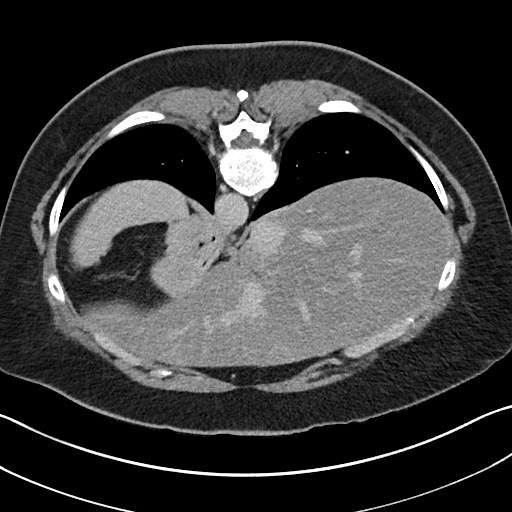
[im 91/106  lung]
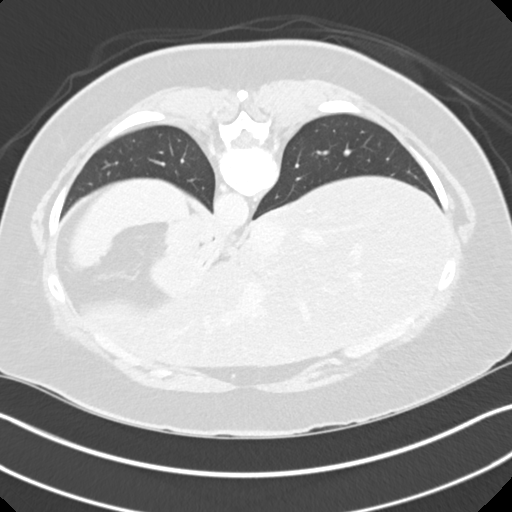

[11 of 46 positions shown; findings below may reference images not displayed]

FINDINGS: Lower chest: There is no pleural effusion identified.

Hepatobiliary: Diffuse hepatic steatosis identified. Previous
cholecystectomy. No biliary ductal dilatation.

Pancreas: Unremarkable. No pancreatic ductal dilatation or
surrounding inflammatory changes.

Spleen: Normal in size without focal abnormality.

Adrenals/Urinary Tract: Normal adrenal glands.

There is a exophytic cyst arising from lateral cortex of right
kidney measuring 3.9 cm. Incompletely characterized without IV
contrast.

Right mid kidney stone measures 9 mm, image 79/5. Punctate stone
noted within the inferior pole of the right kidney measuring 2 mm
and in the upper pole of the right kidney there is a tiny stone
measuring 2 mm. Within the upper pole of the left kidney there is a
3 mm stone. Tiny left mid kidney stone measures 2 mm. No
hydronephrosis identified bilaterally. No ureteral calculi. The
urinary bladder appears within normal limits.

Stomach/Bowel: Stomach is within normal limits. No evidence of bowel
wall thickening, distention, or inflammatory changes.

Vascular/Lymphatic: No significant vascular findings are present. No
enlarged abdominal or pelvic lymph nodes.

Reproductive: Status post hysterectomy. No adnexal masses.

Other: No abdominal wall hernia or abnormality. No abdominopelvic
ascites.

Musculoskeletal: No acute or significant osseous findings.
IMPRESSION: 1. No acute findings within the abdomen or pelvis.
2. Bilateral nonobstructing nephrolithiasis.
3. Hepatic steatosis identified.

## 2021-02-12 ENCOUNTER — Other Ambulatory Visit: Payer: Self-pay

## 2021-02-12 ENCOUNTER — Ambulatory Visit: Payer: Medicare PPO | Admitting: Podiatry

## 2021-02-12 ENCOUNTER — Ambulatory Visit (INDEPENDENT_AMBULATORY_CARE_PROVIDER_SITE_OTHER): Payer: Medicare PPO

## 2021-02-12 ENCOUNTER — Encounter: Payer: Self-pay | Admitting: Podiatry

## 2021-02-12 DIAGNOSIS — M6740 Ganglion, unspecified site: Secondary | ICD-10-CM

## 2021-02-12 DIAGNOSIS — M7989 Other specified soft tissue disorders: Secondary | ICD-10-CM | POA: Diagnosis not present

## 2021-02-12 DIAGNOSIS — S90222A Contusion of left lesser toe(s) with damage to nail, initial encounter: Secondary | ICD-10-CM | POA: Diagnosis not present

## 2021-02-15 NOTE — Progress Notes (Signed)
Subjective:   Patient ID: Tina Crawford, female   DOB: 65 y.o.   MRN: [id number]   HPI 65 year old female presents the office today for concerns of her left big toenail with darkness on the base of the nail.  She states that started in July 2022 when she went to [employer] and she wore shoes that was too small.  She notices a crack along the toenail on the right side hallux toenail as well as the left.  No significant pain of the nails at this time no redness or drainage or any swelling.  She also noticed a "bubble" on the left fourth toe.  This is been there for a few years has not changed.  She was trying to pop it herself but she does not look any fluid out of it.  She is using a callus pad.  No recent injury.  No other concerns.   Review of Systems  All other systems reviewed and are negative.  Past Medical History:  Diagnosis Date   Atypical chest pain    Blurry vision, bilateral    DDD (degenerative disc disease), cervical    Hypercalcemia    Hyperglycemia    Kidney stone    Neck pain    Numbness and tingling    Ocular hypertension    PVD (posterior vitreous detachment)    Right   Radiculopathy of cervical region    SOB (shortness of breath)     Past Surgical History:  Procedure Laterality Date   ABDOMINAL HYSTERECTOMY     APPENDECTOMY     CHOLECYSTECTOMY     COLONOSCOPY     ESOPHAGOGASTRODUODENOSCOPY     ESOPHAGOGASTRODUODENOSCOPY (EGD) WITH PROPOFOL N/A 04/21/2018   Procedure: ESOPHAGOGASTRODUODENOSCOPY (EGD) WITH PROPOFOL;  Surgeon: Toledo, Boykin Nearing, MD;  Location: [facility] ENDOSCOPY;  Service: Gastroenterology;  Laterality: N/A;   PHOTOREFRACTIVE KERATOTOMY Bilateral    TONSILLECTOMY       Current Outpatient Medications:    loratadine (CLARITIN) 10 MG tablet, Take 10 mg by mouth daily., Disp: , Rfl:   Allergies  Allergen Reactions   Hydrocodone Other (See Comments)          Objective:  Physical Exam  General: AAO x3, NAD  Dermatological: On the left  hallux toenail the base of the nail is what appears to be dried subungual hematoma.  The nail is firmly here to the nailbed.  On the right side there is very minimal dark discoloration, stable hematoma.  There is a linear split on the central portion of the nail the right side with again the nails from adhered.  There is no edema, erythema or signs of infection.  No open lesions.  Vascular: Dorsalis Pedis artery and Posterior Tibial artery pedal pulses are 2/4 bilateral with immedate capillary fill time. There is no pain with calf compression, swelling, warmth, erythema.   Neruologic: Grossly intact via light touch bilateral.   Musculoskeletal: Small fluid-filled soft tissue mass present on the left DIPJ.  Upon draining this is clear, childlike substance.  There is no edema, erythema or signs of infection.  There is no other areas of discomfort identified today.  Muscular strength 5/5 in all groups tested bilateral.  Gait: Unassisted, Nonantalgic.       Assessment:   Left fourth digital likely mucoid cyst; subungual hematoma, onychodystrophy     Plan:  -Treatment options discussed including all alternatives, risks, and complications -Etiology of symptoms were discussed -X-rays were obtained and reviewed with the patient.  No  evidence of acute fracture, stress fracture or calcifications are noted -Regards the left fourth toe discussed aspiration of the area.  She agrees and verbal consent obtained.  Skin was cleaned with Betadine, alcohol.  I utilized ethyl chloride to anesthetize the skin and then a 25-gauge needle was utilized to puncture the cyst and clear, gel like fluid was expressed.  No purulence or signs of infection.  I then infiltrated quarter cc of dexamethasone phosphate into the area.  Compression bandage applied.  She tolerated the procedure well and complications.  Postinjection care discussed. -Regards the toenails hopefully the blood will grow out over time.  I do think that the  ridging as well as the subungual hematoma is due to microtrauma.  She can try vitamin E oil on the nail to help with the overall nail health as well.  Monitor for any signs or symptoms of infection of losing the nail/ingrown toenails.  Vivi Barrack DPM
